# Patient Record
Sex: Female | Born: 1980 | Race: White | Hispanic: No | Marital: Married | Smoking: Never smoker
Health system: Southern US, Community
[De-identification: ages and names within clinical notes are randomized; demographics above are authoritative.]

## PROBLEM LIST (undated history)

## (undated) DIAGNOSIS — F329 Major depressive disorder, single episode, unspecified: Secondary | ICD-10-CM

## (undated) DIAGNOSIS — O2340 Unspecified infection of urinary tract in pregnancy, unspecified trimester: Secondary | ICD-10-CM

## (undated) DIAGNOSIS — F32A Depression, unspecified: Secondary | ICD-10-CM

## (undated) DIAGNOSIS — K219 Gastro-esophageal reflux disease without esophagitis: Secondary | ICD-10-CM

## (undated) DIAGNOSIS — F419 Anxiety disorder, unspecified: Secondary | ICD-10-CM

## (undated) DIAGNOSIS — Z789 Other specified health status: Secondary | ICD-10-CM

## (undated) HISTORY — PX: DILATION AND CURETTAGE OF UTERUS: SHX78

## (undated) HISTORY — PX: APPENDECTOMY: SHX54

## (undated) HISTORY — PX: TUMOR REMOVAL: SHX12

---
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
## 2000-06-16 ENCOUNTER — Observation Stay (HOSPITAL_COMMUNITY): Admission: EM | Admit: 2000-06-16 | Discharge: 2000-06-17 | Payer: Self-pay | Admitting: *Deleted

## 2000-06-21 ENCOUNTER — Other Ambulatory Visit (HOSPITAL_COMMUNITY): Admission: RE | Admit: 2000-06-21 | Discharge: 2000-07-01 | Payer: Self-pay | Admitting: *Deleted

## 2000-12-19 ENCOUNTER — Inpatient Hospital Stay (HOSPITAL_COMMUNITY): Admission: AD | Admit: 2000-12-19 | Discharge: 2000-12-19 | Payer: Self-pay | Admitting: Obstetrics & Gynecology

## 2001-01-16 ENCOUNTER — Inpatient Hospital Stay (HOSPITAL_COMMUNITY): Admission: AD | Admit: 2001-01-16 | Discharge: 2001-01-18 | Payer: Self-pay | Admitting: *Deleted

## 2001-07-07 ENCOUNTER — Encounter: Payer: Self-pay | Admitting: Emergency Medicine

## 2001-07-07 ENCOUNTER — Emergency Department (HOSPITAL_COMMUNITY): Admission: EM | Admit: 2001-07-07 | Discharge: 2001-07-08 | Payer: Self-pay | Admitting: *Deleted

## 2002-12-27 ENCOUNTER — Other Ambulatory Visit: Admission: RE | Admit: 2002-12-27 | Discharge: 2002-12-27 | Payer: Self-pay | Admitting: Obstetrics & Gynecology

## 2003-03-21 ENCOUNTER — Ambulatory Visit (HOSPITAL_COMMUNITY): Admission: AD | Admit: 2003-03-21 | Discharge: 2003-03-21 | Payer: Self-pay | Admitting: Obstetrics & Gynecology

## 2003-03-21 ENCOUNTER — Encounter (INDEPENDENT_AMBULATORY_CARE_PROVIDER_SITE_OTHER): Payer: Self-pay | Admitting: *Deleted

## 2004-09-02 ENCOUNTER — Other Ambulatory Visit: Admission: RE | Admit: 2004-09-02 | Discharge: 2004-09-02 | Payer: Self-pay | Admitting: Obstetrics & Gynecology

## 2004-12-30 ENCOUNTER — Inpatient Hospital Stay (HOSPITAL_COMMUNITY): Admission: AD | Admit: 2004-12-30 | Discharge: 2004-12-30 | Payer: Self-pay | Admitting: Obstetrics and Gynecology

## 2005-03-17 ENCOUNTER — Inpatient Hospital Stay (HOSPITAL_COMMUNITY): Admission: AD | Admit: 2005-03-17 | Discharge: 2005-03-19 | Payer: Self-pay | Admitting: Obstetrics & Gynecology

## 2005-05-31 ENCOUNTER — Other Ambulatory Visit: Admission: RE | Admit: 2005-05-31 | Discharge: 2005-05-31 | Payer: Self-pay | Admitting: Obstetrics & Gynecology

## 2006-03-14 ENCOUNTER — Inpatient Hospital Stay (HOSPITAL_COMMUNITY): Admission: AD | Admit: 2006-03-14 | Discharge: 2006-03-14 | Payer: Self-pay | Admitting: Obstetrics & Gynecology

## 2006-03-15 ENCOUNTER — Inpatient Hospital Stay (HOSPITAL_COMMUNITY): Admission: AD | Admit: 2006-03-15 | Discharge: 2006-03-17 | Payer: Self-pay | Admitting: Obstetrics & Gynecology

## 2009-05-21 ENCOUNTER — Ambulatory Visit (HOSPITAL_COMMUNITY): Admission: RE | Admit: 2009-05-21 | Discharge: 2009-05-21 | Payer: Self-pay | Admitting: Obstetrics and Gynecology

## 2009-07-16 ENCOUNTER — Ambulatory Visit (HOSPITAL_COMMUNITY): Admission: RE | Admit: 2009-07-16 | Discharge: 2009-07-16 | Payer: Self-pay | Admitting: Obstetrics and Gynecology

## 2009-12-18 ENCOUNTER — Inpatient Hospital Stay (HOSPITAL_COMMUNITY): Admission: AD | Admit: 2009-12-18 | Discharge: 2009-12-18 | Payer: Self-pay | Admitting: Obstetrics and Gynecology

## 2009-12-19 ENCOUNTER — Inpatient Hospital Stay (HOSPITAL_COMMUNITY): Admission: AD | Admit: 2009-12-19 | Discharge: 2009-12-21 | Payer: Self-pay | Admitting: Obstetrics and Gynecology

## 2010-12-25 LAB — CBC
HCT: 32.4 % — ABNORMAL LOW (ref 36.0–46.0)
Hemoglobin: 10.6 g/dL — ABNORMAL LOW (ref 12.0–15.0)
MCHC: 32.5 g/dL (ref 30.0–36.0)
MCHC: 32.8 g/dL (ref 30.0–36.0)
MCV: 78.1 fL (ref 78.0–100.0)
MCV: 78.6 fL (ref 78.0–100.0)
Platelets: 248 10*3/uL (ref 150–400)
Platelets: 280 10*3/uL (ref 150–400)
RBC: 3.73 MIL/uL — ABNORMAL LOW (ref 3.87–5.11)
RDW: 17.1 % — ABNORMAL HIGH (ref 11.5–15.5)
WBC: 15.2 10*3/uL — ABNORMAL HIGH (ref 4.0–10.5)

## 2010-12-25 LAB — COMPREHENSIVE METABOLIC PANEL
Albumin: 2.7 g/dL — ABNORMAL LOW (ref 3.5–5.2)
BUN: 7 mg/dL (ref 6–23)
Creatinine, Ser: 0.36 mg/dL — ABNORMAL LOW (ref 0.4–1.2)
Glucose, Bld: 87 mg/dL (ref 70–99)
Total Protein: 5.7 g/dL — ABNORMAL LOW (ref 6.0–8.3)

## 2010-12-25 LAB — RPR: RPR Ser Ql: NONREACTIVE

## 2010-12-25 LAB — URIC ACID: Uric Acid, Serum: 4.5 mg/dL (ref 2.4–7.0)

## 2010-12-25 LAB — LACTATE DEHYDROGENASE: LDH: 123 U/L (ref 94–250)

## 2011-02-19 NOTE — Op Note (Signed)
   NAME:  Janet Stewart, Janet Stewart                          ACCOUNT NO.:  [id number]   MEDICAL RECORD NO.:  [id number]                   PATIENT TYPE:  AMB   LOCATION:  SDC                                  FACILITY:  [facility]   PHYSICIAN:  Freddy Finner, M.D.                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   POSTOPERATIVE DIAGNOSES:  Multiple congenital anomalies of the fetus  inconsistent with life at 19 weeks to station.   OPERATIVE PROCEDURE:  D&E.   ESTIMATED BLOOD LOSS:  300 mL.   ANESTHESIA:  General.   COMPLICATIONS:  None. The patient blood type is known to be A POSITIVE.   INDICATIONS FOR PROCEDURE:  Details of the present illness recorded in the  admission note. The patient was admitted on the morning of surgery to the  outpatient surgery unit. Laminaria x2 were placed in the cervix on the day  prior to her procedure.   DESCRIPTION OF PROCEDURE:  She was brought to the operating room and there  placed under adequate general anesthesia and placed in the dorsal lithotomy  position using Allen stirrup system. Betadine prep was carried out in the  usual fashion and guaze packing and laminaria were removed from the cervix  and vagina. The cervix was visualized and grasped on the anterior lip with  ring forceps and progressively dilated with Hegar dilators to approximately  1.6 cm. The 16 mm curved suction canula was then introduced and aspiration  produced obvious products of conception. There was not a lot of fluid which  was noted by a previous ultrasound. After adequate vacuum extraction large  placental forceps were used to gradually remove fetal parts. This was  continued along with intermittent repeat vacuum aspiration until all  anatomical parts were identified. Most  importantly the skull was identified  and removed. The patient was given intravenous potassium through the IV and  subcu methods in 0.25 mg intraoperatively. She is to be discharged and the  immediate postoperative period for follow up in the office in approximately  two weeks. She is to take ibuprofen as needed for postoperative pain. The  plan is to restart her psychotropic medications. The patient was discharged  home as noted.                                               Freddy Finner, M.D.    WRN/MEDQ  D:  03/21/2003  T:  03/21/2003  Job:  [id number]

## 2011-02-19 NOTE — Discharge Summary (Signed)
[facility]  Patient:    Janet Stewart, Janet Stewart                         MRN: [id number] Adm. Date:  [id number] Disc. Date: [id number] Attending:  Denny Peon                           Discharge Summary  INTRODUCTION:  Janet Stewart is a 30 year old white single female admitted after expressing suicidal plans.  She has history of recurrent mood swings, suggesting 4 bipolar illness.  Details are available in the initial evaluation.  HOSPITAL COURSE:  Patient was admitted to the ward.  She was already on Zoloft 10 because of pregnancy.  I started gradually decreasing her Zoloft.  I ordered serum pregnancy test which came positive.  Patient was informed about her pregnancy and it made things worse for her.  She was already somehow overwhelmed prior to admission and she broke down in tears when she learned about being pregnant.  To make things worse at one point, she was informed by the staff that pregnancy test was negative and later learned otherwise.  There were also significant problems with patients insurance company which wanted to transfer patient to another hospital, since our hospital was not in her [name] network.  Patient had the choice to be discharged which we did not feel she was ready, therefore, take responsibility for the balance of the bill or agree for transfer.  Patient agreed to be transferred and she was accepted by [facility].  MEDICAL PROBLEMS:  Patient did not have medical problems during this hospital stay.  LABORATORY:  Electrolytes and blood count were normal.  Hemoglobin 13, hematocrit 38.  Pregnancy test, both urine and serum positive.  MENTAL STATUS:  At the time of discharge she presented with a pleasant affect, denied dangerous ideations, but her affect was still labile and we did not feel that she is ready to go home.  Her family agreed to take responsibility for her during the transfer to another facility.  Since  patient was not actively suicidal, I felt it was a safe solution.  As per patient request, I did not share with her mother or boyfriend the fact that the patient is most likely pregnant.  Again a consultation was ordered but cancelled, since it was obvious that patient is going to be transferred to another hospital and before late evening tonight, we are not able to obtain the consult.  DISCHARGE DIAGNOSES: Axis I:    Bipolar 2 disorder, depressed. Axis II:   Personality disorder, not otherwise specified. Axis III:  Pregnancy. Axis IV:   Moderate stressors. Axis V:    GAF on admission 30, upon discharge 40, maximum over past year            is 75.  RECOMMENDATION:  Patient is being transferred to another hospital for continuous treatment of her depression.  Upon arrival she should be evaluated by a gynecologist, and decision should be made about her medication. DD:  06/17/00 TD:  06/18/00 Job: [id number]

## 2011-02-19 NOTE — H&P (Signed)
NAME:  Janet Stewart, Janet Stewart                          ACCOUNT NO.:  [id number]   MEDICAL RECORD NO.:  [id number]                   PATIENT TYPE:   LOCATION:                                       FACILITY:   PHYSICIAN:  Freddy Finner, M.D.                DATE OF BIRTH:  1981/02/16   DATE OF ADMISSION:  03/21/2003  DATE OF DISCHARGE:                                HISTORY & PHYSICAL   ADMISSION DIAGNOSIS:  Significant fetal anomaly with ultrasound findings not  compatible with continued  intrauterine or extrauterine survival.   HISTORY OF PRESENT ILLNESS:  The patient is a 30 year old white married  female, gravida 2, para 1, who has been followed in the office to now [redacted]  weeks gestational age.  On March 18, 2003, she had a pelvic ultrasound for  routine obstetrical screening and anatomical survey with the findings in our  office of increased nickel fold thickening, marked enlargement of the  bladder, profound oligohydramnios and suggestion of scoliosis of the spine.  She was referred to Dr. Margorie Stewart at the [facility],  who evaluated her with an ultrasound on March 19, 2003, and confirmed the  marked enlargement of the bladder with a centrally low amniotic fluid.  This  he felt was consistent with bladder outlet obstruction, of undetermined  etiology at this point, but incompatible with continued intrauterine growth  or viable results of the pregnancy.  Because of these findings, the patient  is now admitted for a D&E, for termination of the pregnancy.   REVIEW OF SYSTEMS:  Her current review of systems is negative.   PAST MEDICAL HISTORY/FAMILY HISTORY:  Recorded in detail in the prenatal  summary and will not be repeated at this time.   PHYSICAL EXAMINATION:  HEENT:  Grossly within normal limits.  NECK:  Thyroid gland is not palpably enlarged.  VITAL SIGNS:  Blood pressure in the office 124/80.  CHEST:  Clear to auscultation.  HEART:  A normal sinus rhythm  without murmurs, rubs, or gallops.  ABDOMEN:  Gravid.  Fundal height of E-1.  EXTREMITIES:  Without clubbing, cyanosis, or edema.   ASSESSMENT:  Ultimately non-viable intrauterine pregnancy with significant  anomalies.   PLAN:  A dilatation and evacuation.    NOTATION:  The procedure was discussed at length with the patient by  telephone on March 19, 2003.  The risks of hemorrhage and uterine perforation  requiring additional surgery were discussed with the patient.  Based on this  as one of her two options for the termination of pregnancy, she has elected  to proceed with this procedure.  She is admitted at this time for that  purpose.  Freddy Finner, M.D.    WRN/MEDQ  D:  03/19/2003  T:  03/19/2003  Job:  [id number]

## 2011-02-19 NOTE — Op Note (Signed)
[facility]  Patient:    Janet Stewart, Janet Stewart                       MRN: [id number] Proc. Date: 01/16/01 Adm. Date:  [id number] Disc. Date: [id number] Attending:  Rhina Brackett                           Operative Report  PROCEDURE:                    Vacuum extraction.  SURGEON:                      Guy Sandifer. Arleta Creek, M.D.  INDICATIONS AND CONSENT:      The patient is a 30 year old married white female, G1, P0 with an EDC of January 19, 2001.  Prenatal care was complicated by possible bipolar disorder, although no medications at present and no symptoms at present.  Group B Strep culture was positive.  The patient was admitted to the hospital at 5 cm dilation, where she was placed on IV antibiotic prophylaxis for group B Strep sepsis.  Epidural was placed.  At 12:05 p.m., the cervix was 9 cm dilated, -1 station and artificial rupture of membranes for clear fluid was carried out.  She progressed to second stage and pushed for approximately two hours.  Fetal heart tones were 160s to 170s with accelerations.  The patient was exhausted and requesting help.  Vacuum extraction was discussed with the patient and her husband.  A 1 in 40,000 risk of severe morbidity and mortality was discussed.  All questions were answered. The vertex was at +2 station between pushes and +3 to +4 station with a push. ROA.  DESCRIPTION OF PROCEDURE:     The bladder was straight catheterized.  A Kiwi vacuum extractor was placed and steady progress over the course of 3-4 contractions was made.  A viable female infant with Apgars of 8 and 9 at one and five minutes respectively was delivered.  Birth weight pending.  Arterial cord pH was 7.19.  The placenta was three vessels and intact.  The cervix was without lacerations.  There were superficial bilateral periurethral lacerations, not repaired and not bleeding.  A second degree midline episiotomy had a small right sulcus  extension.  This was repaired using 3-0 Rapide suture.  The patient and infant were stable in the labor delivery room. DD:  01/16/01 TD:  01/16/01 Job: [id number]

## 2011-02-19 NOTE — H&P (Signed)
[facility]  Patient:    JACLYN, ANDY                         MRN: [id number] Adm. Date:  [id number] Disc. Date: [id number] Attending:  Denny Peon                   Psychiatric Admission Assessment  INTRODUCTION:  Tiwatope Emmitt is a 30 year old white single female who was admitted on involuntary papers via emergency room after expressing suicidal plans to overdose.  Patient has no previous history of suicidal behavior.  A few days prior to admission she left her boyfriend with whom she lived since February.  Patient felt like she was being a burden to him.  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  The grounding of all flights due to occurrences in [place] prevented her from departure.  Patient reports being depressed for at least six weeks.  She was started by her family doctor on Zoloft which was increased to up to 100 mg daily.  She complained of side effects of headache and mood not much better. She complained of irritability and _________ and mood swings.  In the past she tended to make rapid changes, not always well thought, like leaving collage, breaking employment arrangements, breaking engagements, etc.  Patient denies any history of substance abuse.  She gives history of depression starting as early as age 8.  She was treated first with Prozac, later with Serzone.  Both medications were somewhat helpful, but patient later developed severe mood swings of hypomanic way like qualities.  At one point she became suicidal.  SOCIAL HISTORY:  Patient has some family support.  She dropped out of college which did not make family happy.  She does not work and intends to start studying again.  In the past she was a good Consulting civil engineer.  FAMILY HISTORY:  Significant for two maternal uncles with depression.  ALCOHOL AND DRUG HISTORY:  Negative.  MEDICAL PROBLEMS:  Patient denied medical problems.  Last  menstrual period was at the end of August of 2001.   Pregnancy test in urine was positive in emergency room.  Patient does not use birth control pills, but she was not recently sexually active.  She uses Zoloft 100 mg daily.  PHYSICAL EXAMINATION:  Normal in the emergency room.  MENTAL STATUS:  Patient is overweight, quite chubby, white female with superficially bright affect, but easy to cry.  Normal speech, neat appearance and behavior.  Speech was fast but not pressured.  Mood was depressed and affect was labile.  She reported racing thoughts, denied suicidal thoughts, but felt confused what to do next.  She was overwhelmed by the fact of a possible pregnancy.  There were no delusions, no ideas of reference, no homicidal thoughts, suicidal thoughts, no paranoia.  Alert, oriented x 3 with good memory.  Concentration was decreased.  Intellectually normal.  Insight was superficial and judgment poor.  DIAGNOSTIC IMPRESSION: Axis I:    Bipolar disorder 2, mixed. Axis II:   Personality disorder, not otherwise specified. Axis III:  Rule out pregnancy. Axis IV:   Moderate stressor, primary support group. Axis V:    GAF at the time of admission 30, maximum over past year 75.  RECOMMENDATION:  We will start patient on special _________ in an attempt to make a family meeting.  Ask for OB-GYN  consultation to endorse problem of medication.  Patient seems to be in need of antidepressant and individual mood stabilizer.  At the same time, her pregnancy does not allow [place] to use medication freely.  Patient is on Zoloft for the past month or so.  Will gradually decreased Zoloft to avoid withdrawal and with being in touch with obstetrician, will try to establish safe pattern of mediation in the future. Patient agreed to this plan as outlined. DD:  06/17/00 TD:  06/18/00 Job: [id number]

## 2013-05-04 ENCOUNTER — Other Ambulatory Visit: Payer: Self-pay | Admitting: Gastroenterology

## 2013-05-04 DIAGNOSIS — R1032 Left lower quadrant pain: Secondary | ICD-10-CM

## 2013-05-11 ENCOUNTER — Other Ambulatory Visit: Payer: Self-pay

## 2013-05-16 ENCOUNTER — Other Ambulatory Visit: Payer: Self-pay

## 2013-05-24 ENCOUNTER — Ambulatory Visit
Admission: RE | Admit: 2013-05-24 | Discharge: 2013-05-24 | Disposition: A | Discharge status: Home / Self Care | Payer: BC Managed Care – PPO | Source: Ambulatory Visit | Attending: Gastroenterology | Admitting: Gastroenterology

## 2013-05-24 DIAGNOSIS — R1032 Left lower quadrant pain: Secondary | ICD-10-CM

## 2013-05-24 MED ORDER — IOHEXOL 300 MG/ML  SOLN
125.0000 mL | Freq: Once | INTRAMUSCULAR | Status: AC | PRN
  Administered 2013-05-24: 125 mL via INTRAVENOUS

## 2018-10-04 NOTE — L&D Delivery Note (Addendum)
Delivery Note  First Stage: Labor onset: 04/03/2019 @ 8pm  Augmentation: Cytotec 24mcg  Analgesia /Anesthesia intrapartum: Fentanyl 180mcg IVP x 1 SROM at 8pm for clear fluid  Second Stage: Complete dilation at 0306 Onset of pushing at 0306 FHR second stage: Cat 2/Cat 3  Delivery of a viable female at 65 by Lars Pinks, CNM in LOA position with left compound hand. I moved left hand across body and delivered posterior arm, then anterior shoulder and body followed easily. No nuchal cord.   Cord double clamped after cessation of pulsation, cut by CNM and brought to warmer for NICU to assess due to poor tone.  Cord blood sample collected   Third Stage: Large placenta with thick umbilical cord delivered via Delena Bali intact with 3 VC @ 0325 Placenta disposition: hospital disposal  Uterine tone firm / bleeding minimal   No lacerations identified   Est. Blood Loss (mL): 27mL by QBL; I estimated about 40XB  Complications: none  Mom to postpartum.  Baby to Couplet care / Skin to Skin.  Newborn: Birth Weight: 8#15oz Apgar Scores: 6, 8 Feeding planned: breast  Lars Pinks, MSN, CNM [facility]

## 2018-11-22 ENCOUNTER — Other Ambulatory Visit (HOSPITAL_COMMUNITY): Payer: Self-pay | Admitting: Obstetrics & Gynecology

## 2018-11-22 ENCOUNTER — Other Ambulatory Visit (HOSPITAL_COMMUNITY): Payer: Self-pay | Admitting: *Deleted

## 2018-11-22 ENCOUNTER — Ambulatory Visit (HOSPITAL_COMMUNITY)
Admission: RE | Admit: 2018-11-22 | Discharge: 2018-11-22 | Disposition: A | Discharge status: Home / Self Care | Payer: 59 | Source: Ambulatory Visit | Attending: Obstetrics & Gynecology | Admitting: Obstetrics & Gynecology

## 2018-11-22 ENCOUNTER — Encounter (HOSPITAL_COMMUNITY): Payer: Self-pay

## 2018-11-22 DIAGNOSIS — Z3689 Encounter for other specified antenatal screening: Secondary | ICD-10-CM | POA: Insufficient documentation

## 2018-11-22 DIAGNOSIS — O09212 Supervision of pregnancy with history of pre-term labor, second trimester: Secondary | ICD-10-CM

## 2018-11-22 DIAGNOSIS — O359XX0 Maternal care for (suspected) fetal abnormality and damage, unspecified, not applicable or unspecified: Secondary | ICD-10-CM | POA: Diagnosis not present

## 2018-11-22 DIAGNOSIS — R188 Other ascites: Secondary | ICD-10-CM

## 2018-11-22 DIAGNOSIS — Z3A19 19 weeks gestation of pregnancy: Secondary | ICD-10-CM

## 2018-11-22 DIAGNOSIS — Z3A21 21 weeks gestation of pregnancy: Secondary | ICD-10-CM

## 2018-11-22 DIAGNOSIS — O09292 Supervision of pregnancy with other poor reproductive or obstetric history, second trimester: Secondary | ICD-10-CM

## 2018-11-22 DIAGNOSIS — Z363 Encounter for antenatal screening for malformations: Secondary | ICD-10-CM

## 2018-11-22 DIAGNOSIS — O09522 Supervision of elderly multigravida, second trimester: Secondary | ICD-10-CM | POA: Diagnosis not present

## 2018-11-22 DIAGNOSIS — O283 Abnormal ultrasonic finding on antenatal screening of mother: Secondary | ICD-10-CM

## 2018-11-22 HISTORY — DX: Other specified health status: Z78.9

## 2018-11-23 ENCOUNTER — Other Ambulatory Visit (HOSPITAL_COMMUNITY): Payer: Self-pay | Admitting: *Deleted

## 2018-11-23 DIAGNOSIS — IMO0002 Reserved for concepts with insufficient information to code with codable children: Secondary | ICD-10-CM

## 2018-11-23 DIAGNOSIS — R188 Other ascites: Principal | ICD-10-CM

## 2018-11-23 LAB — TOXOPLASMA ANTIBODIES- IGG AND  IGM: Toxoplasma Antibody- IgM: <3 AU/mL (ref 0.0–7.9)

## 2018-11-23 LAB — CMV ANTIBODY, IGG (EIA)
CMV AB - IGG: 1.6 U/mL — AB (ref 0.00–0.59)
CMV AB - IGG: 1.6 U/mL — AB (ref 0.00–0.59)

## 2018-11-23 LAB — CMV IGM: CMV IgM: <30 AU/mL (ref 0.0–29.9)

## 2018-11-24 ENCOUNTER — Other Ambulatory Visit (HOSPITAL_COMMUNITY): Payer: Self-pay | Admitting: Obstetrics & Gynecology

## 2018-12-06 ENCOUNTER — Ambulatory Visit (HOSPITAL_COMMUNITY)
Admission: RE | Admit: 2018-12-06 | Discharge: 2018-12-06 | Disposition: A | Discharge status: Home / Self Care | Payer: 59 | Source: Ambulatory Visit | Attending: Obstetrics & Gynecology | Admitting: Obstetrics & Gynecology

## 2018-12-06 ENCOUNTER — Other Ambulatory Visit: Payer: Self-pay

## 2018-12-06 ENCOUNTER — Ambulatory Visit (HOSPITAL_COMMUNITY): Payer: 59 | Admitting: *Deleted

## 2018-12-06 ENCOUNTER — Other Ambulatory Visit (HOSPITAL_COMMUNITY): Payer: Self-pay | Admitting: *Deleted

## 2018-12-06 ENCOUNTER — Encounter (HOSPITAL_COMMUNITY): Payer: Self-pay

## 2018-12-06 VITALS — BP 135/82 | HR 96 | Ht 63.0 in | Wt 224.2 lb

## 2018-12-06 DIAGNOSIS — O283 Abnormal ultrasonic finding on antenatal screening of mother: Secondary | ICD-10-CM | POA: Diagnosis present

## 2018-12-06 DIAGNOSIS — R188 Other ascites: Secondary | ICD-10-CM | POA: Diagnosis present

## 2018-12-06 DIAGNOSIS — O359XX0 Maternal care for (suspected) fetal abnormality and damage, unspecified, not applicable or unspecified: Secondary | ICD-10-CM

## 2018-12-06 DIAGNOSIS — O09292 Supervision of pregnancy with other poor reproductive or obstetric history, second trimester: Secondary | ICD-10-CM | POA: Diagnosis not present

## 2018-12-06 DIAGNOSIS — O09212 Supervision of pregnancy with history of pre-term labor, second trimester: Secondary | ICD-10-CM

## 2018-12-06 DIAGNOSIS — Z3A21 21 weeks gestation of pregnancy: Secondary | ICD-10-CM

## 2018-12-06 DIAGNOSIS — IMO0002 Reserved for concepts with insufficient information to code with codable children: Secondary | ICD-10-CM

## 2018-12-06 DIAGNOSIS — O09522 Supervision of elderly multigravida, second trimester: Secondary | ICD-10-CM

## 2018-12-06 DIAGNOSIS — B259 Cytomegaloviral disease, unspecified: Secondary | ICD-10-CM

## 2018-12-13 LAB — MISC LABCORP TEST (SEND OUT)
LABCORP TEST CODE: 819024
MISC LABCORP RESULT: UNDETERMINED

## 2018-12-26 ENCOUNTER — Ambulatory Visit (HOSPITAL_COMMUNITY): Payer: 59

## 2018-12-26 ENCOUNTER — Encounter (HOSPITAL_COMMUNITY): Payer: Self-pay

## 2019-02-07 ENCOUNTER — Encounter (HOSPITAL_COMMUNITY): Payer: Self-pay | Admitting: *Deleted

## 2019-02-07 ENCOUNTER — Ambulatory Visit (HOSPITAL_COMMUNITY): Payer: 59 | Admitting: *Deleted

## 2019-02-07 ENCOUNTER — Other Ambulatory Visit: Payer: Self-pay

## 2019-02-07 ENCOUNTER — Ambulatory Visit (HOSPITAL_COMMUNITY)
Admission: RE | Admit: 2019-02-07 | Discharge: 2019-02-07 | Disposition: A | Discharge status: Home / Self Care | Payer: 59 | Source: Ambulatory Visit | Attending: Obstetrics and Gynecology | Admitting: Obstetrics and Gynecology

## 2019-02-07 ENCOUNTER — Other Ambulatory Visit (HOSPITAL_COMMUNITY): Payer: Self-pay | Admitting: Obstetrics & Gynecology

## 2019-02-07 VITALS — Temp 98.9°F

## 2019-02-07 DIAGNOSIS — O09213 Supervision of pregnancy with history of pre-term labor, third trimester: Secondary | ICD-10-CM

## 2019-02-07 DIAGNOSIS — Z362 Encounter for other antenatal screening follow-up: Secondary | ICD-10-CM | POA: Diagnosis not present

## 2019-02-07 DIAGNOSIS — O283 Abnormal ultrasonic finding on antenatal screening of mother: Secondary | ICD-10-CM

## 2019-02-07 DIAGNOSIS — R188 Other ascites: Principal | ICD-10-CM

## 2019-02-07 DIAGNOSIS — O09523 Supervision of elderly multigravida, third trimester: Secondary | ICD-10-CM | POA: Diagnosis not present

## 2019-02-07 DIAGNOSIS — O09293 Supervision of pregnancy with other poor reproductive or obstetric history, third trimester: Secondary | ICD-10-CM | POA: Diagnosis not present

## 2019-02-07 DIAGNOSIS — Z3A30 30 weeks gestation of pregnancy: Secondary | ICD-10-CM

## 2019-02-07 DIAGNOSIS — O359XX0 Maternal care for (suspected) fetal abnormality and damage, unspecified, not applicable or unspecified: Secondary | ICD-10-CM

## 2019-02-07 DIAGNOSIS — IMO0002 Reserved for concepts with insufficient information to code with codable children: Secondary | ICD-10-CM

## 2019-02-08 ENCOUNTER — Other Ambulatory Visit (HOSPITAL_COMMUNITY): Payer: Self-pay | Admitting: *Deleted

## 2019-02-08 DIAGNOSIS — R188 Other ascites: Principal | ICD-10-CM

## 2019-02-08 DIAGNOSIS — IMO0002 Reserved for concepts with insufficient information to code with codable children: Secondary | ICD-10-CM

## 2019-03-08 ENCOUNTER — Ambulatory Visit (HOSPITAL_COMMUNITY): Payer: 59 | Admitting: *Deleted

## 2019-03-08 ENCOUNTER — Encounter (HOSPITAL_COMMUNITY): Payer: Self-pay

## 2019-03-08 ENCOUNTER — Ambulatory Visit (HOSPITAL_COMMUNITY)
Admission: RE | Admit: 2019-03-08 | Discharge: 2019-03-08 | Disposition: A | Discharge status: Home / Self Care | Payer: 59 | Source: Ambulatory Visit | Attending: Obstetrics and Gynecology | Admitting: Obstetrics and Gynecology

## 2019-03-08 ENCOUNTER — Other Ambulatory Visit: Payer: Self-pay

## 2019-03-08 VITALS — BP 131/84 | HR 115 | Temp 98.6°F

## 2019-03-08 DIAGNOSIS — R188 Other ascites: Secondary | ICD-10-CM | POA: Diagnosis present

## 2019-03-08 DIAGNOSIS — IMO0002 Reserved for concepts with insufficient information to code with codable children: Secondary | ICD-10-CM

## 2019-03-08 DIAGNOSIS — O0990 Supervision of high risk pregnancy, unspecified, unspecified trimester: Secondary | ICD-10-CM | POA: Diagnosis present

## 2019-03-08 DIAGNOSIS — O359XX0 Maternal care for (suspected) fetal abnormality and damage, unspecified, not applicable or unspecified: Secondary | ICD-10-CM

## 2019-03-08 DIAGNOSIS — O09523 Supervision of elderly multigravida, third trimester: Secondary | ICD-10-CM

## 2019-03-08 DIAGNOSIS — O09293 Supervision of pregnancy with other poor reproductive or obstetric history, third trimester: Secondary | ICD-10-CM | POA: Diagnosis not present

## 2019-03-08 DIAGNOSIS — Z362 Encounter for other antenatal screening follow-up: Secondary | ICD-10-CM | POA: Diagnosis not present

## 2019-03-08 DIAGNOSIS — O09213 Supervision of pregnancy with history of pre-term labor, third trimester: Secondary | ICD-10-CM

## 2019-03-08 DIAGNOSIS — Z3A34 34 weeks gestation of pregnancy: Secondary | ICD-10-CM

## 2019-04-03 ENCOUNTER — Encounter (HOSPITAL_COMMUNITY): Payer: Self-pay | Admitting: *Deleted

## 2019-04-03 ENCOUNTER — Other Ambulatory Visit: Payer: Self-pay

## 2019-04-03 ENCOUNTER — Inpatient Hospital Stay (HOSPITAL_COMMUNITY)
Admission: AD | Admit: 2019-04-03 | Discharge: 2019-04-05 | DRG: 807 | Disposition: A | Discharge status: Home / Self Care | Payer: 59 | Attending: Obstetrics and Gynecology | Admitting: Obstetrics and Gynecology

## 2019-04-03 DIAGNOSIS — O3483 Maternal care for other abnormalities of pelvic organs, third trimester: Secondary | ICD-10-CM | POA: Diagnosis present

## 2019-04-03 DIAGNOSIS — Z3A38 38 weeks gestation of pregnancy: Secondary | ICD-10-CM

## 2019-04-03 DIAGNOSIS — D649 Anemia, unspecified: Secondary | ICD-10-CM | POA: Diagnosis present

## 2019-04-03 DIAGNOSIS — Z1159 Encounter for screening for other viral diseases: Secondary | ICD-10-CM | POA: Diagnosis not present

## 2019-04-03 DIAGNOSIS — O99824 Streptococcus B carrier state complicating childbirth: Secondary | ICD-10-CM | POA: Diagnosis present

## 2019-04-03 DIAGNOSIS — O3663X0 Maternal care for excessive fetal growth, third trimester, not applicable or unspecified: Secondary | ICD-10-CM | POA: Diagnosis present

## 2019-04-03 DIAGNOSIS — Z3689 Encounter for other specified antenatal screening: Secondary | ICD-10-CM | POA: Diagnosis not present

## 2019-04-03 DIAGNOSIS — O9902 Anemia complicating childbirth: Secondary | ICD-10-CM | POA: Diagnosis present

## 2019-04-03 DIAGNOSIS — O4202 Full-term premature rupture of membranes, onset of labor within 24 hours of rupture: Secondary | ICD-10-CM | POA: Diagnosis present

## 2019-04-03 DIAGNOSIS — N83291 Other ovarian cyst, right side: Secondary | ICD-10-CM | POA: Diagnosis present

## 2019-04-03 DIAGNOSIS — O24420 Gestational diabetes mellitus in childbirth, diet controlled: Secondary | ICD-10-CM | POA: Diagnosis present

## 2019-04-03 HISTORY — DX: Depression, unspecified: F32.A

## 2019-04-03 HISTORY — DX: Gastro-esophageal reflux disease without esophagitis: K21.9

## 2019-04-03 HISTORY — DX: Unspecified infection of urinary tract in pregnancy, unspecified trimester: O23.40

## 2019-04-03 HISTORY — DX: Anxiety disorder, unspecified: F41.9

## 2019-04-03 LAB — URINALYSIS, ROUTINE W REFLEX MICROSCOPIC
Bilirubin Urine: NEGATIVE
Glucose, UA: NEGATIVE mg/dL
Ketones, ur: NEGATIVE mg/dL
Leukocytes,Ua: NEGATIVE
Nitrite: NEGATIVE
Protein, ur: 100 mg/dL — AB
RBC / HPF: >50 RBC/hpf — ABNORMAL HIGH (ref 0–5)
Specific Gravity, Urine: 1.006 (ref 1.005–1.030)
pH: 7 (ref 5.0–8.0)

## 2019-04-03 LAB — COMPREHENSIVE METABOLIC PANEL
ALT: 13 U/L (ref 0–44)
AST: 14 U/L — ABNORMAL LOW (ref 15–41)
Albumin: 2.6 g/dL — ABNORMAL LOW (ref 3.5–5.0)
Alkaline Phosphatase: 173 U/L — ABNORMAL HIGH (ref 38–126)
Anion gap: 10 (ref 5–15)
BUN: 6 mg/dL (ref 6–20)
CO2: 22 mmol/L (ref 22–32)
Calcium: 9.1 mg/dL (ref 8.9–10.3)
Chloride: 104 mmol/L (ref 98–111)
Creatinine, Ser: 0.38 mg/dL — ABNORMAL LOW (ref 0.44–1.00)
GFR calc Af Amer: >60 mL/min (ref >60)
GFR calc non Af Amer: >60 mL/min (ref >60)
Glucose, Bld: 95 mg/dL (ref 70–99)
Potassium: 4 mmol/L (ref 3.5–5.1)
Sodium: 136 mmol/L (ref 135–145)
Total Bilirubin: 0.3 mg/dL (ref 0.3–1.2)
Total Protein: 5.8 g/dL — ABNORMAL LOW (ref 6.5–8.1)

## 2019-04-03 LAB — CBC
HCT: 34.8 % — ABNORMAL LOW (ref 36.0–46.0)
Hemoglobin: 10.7 g/dL — ABNORMAL LOW (ref 12.0–15.0)
MCH: 24.8 pg — ABNORMAL LOW (ref 26.0–34.0)
MCHC: 30.7 g/dL (ref 30.0–36.0)
MCV: 80.6 fL (ref 80.0–100.0)
Platelets: 258 10*3/uL (ref 150–400)
RBC: 4.32 MIL/uL (ref 3.87–5.11)
RDW: 19.1 % — ABNORMAL HIGH (ref 11.5–15.5)
WBC: 9.3 10*3/uL (ref 4.0–10.5)
nRBC: 0 % (ref 0.0–0.2)

## 2019-04-03 LAB — POCT FERN TEST: POCT Fern Test: POSITIVE

## 2019-04-03 LAB — TYPE AND SCREEN
ABO/RH(D): O POS
Antibody Screen: NEGATIVE

## 2019-04-03 LAB — URIC ACID: Uric Acid, Serum: 4 mg/dL (ref 2.5–7.1)

## 2019-04-03 LAB — SARS CORONAVIRUS 2 BY RT PCR (HOSPITAL ORDER, PERFORMED IN ~~LOC~~ HOSPITAL LAB): SARS Coronavirus 2: NEGATIVE

## 2019-04-03 MED ORDER — LACTATED RINGERS IV SOLN: 500.0000 mL | INTRAVENOUS | Status: DC | PRN

## 2019-04-03 MED ORDER — LIDOCAINE HCL (PF) 1 % IJ SOLN: 30.0000 mL | INTRAMUSCULAR | Status: DC | PRN

## 2019-04-03 MED ORDER — OXYCODONE-ACETAMINOPHEN 5-325 MG PO TABS: 2.0000 | ORAL_TABLET | ORAL | Status: DC | PRN

## 2019-04-03 MED ORDER — OXYTOCIN 40 UNITS IN NORMAL SALINE INFUSION - SIMPLE MED
2.5000 [IU]/h | INTRAVENOUS | Status: DC
  Filled 2019-04-03: qty 1000

## 2019-04-03 MED ORDER — SOD CITRATE-CITRIC ACID 500-334 MG/5ML PO SOLN: 30.0000 mL | ORAL | Status: DC | PRN

## 2019-04-03 MED ORDER — LACTATED RINGERS IV SOLN
INTRAVENOUS | Status: DC
  Administered 2019-04-03: 23:00:00 via INTRAVENOUS

## 2019-04-03 MED ORDER — ACETAMINOPHEN 325 MG PO TABS: 650.0000 mg | ORAL_TABLET | ORAL | Status: DC | PRN

## 2019-04-03 MED ORDER — OXYTOCIN BOLUS FROM INFUSION
500.0000 mL | Freq: Once | INTRAVENOUS | Status: AC
  Administered 2019-04-04: 500 mL via INTRAVENOUS

## 2019-04-03 MED ORDER — PENICILLIN G 3 MILLION UNITS IVPB - SIMPLE MED: 3.0000 10*6.[IU] | INTRAVENOUS | Status: DC

## 2019-04-03 MED ORDER — FLEET ENEMA 7-19 GM/118ML RE ENEM: 1.0000 | ENEMA | RECTAL | Status: DC | PRN

## 2019-04-03 MED ORDER — ONDANSETRON HCL 4 MG/2ML IJ SOLN: 4.0000 mg | Freq: Four times a day (QID) | INTRAMUSCULAR | Status: DC | PRN

## 2019-04-03 MED ORDER — OXYCODONE-ACETAMINOPHEN 5-325 MG PO TABS
1.0000 | ORAL_TABLET | ORAL | Status: DC | PRN
  Administered 2019-04-04: 04:00:00 1 via ORAL
  Filled 2019-04-03: qty 1

## 2019-04-03 MED ORDER — SODIUM CHLORIDE 0.9 % IV SOLN
5.0000 10*6.[IU] | Freq: Once | INTRAVENOUS | Status: AC
  Administered 2019-04-03: 5 10*6.[IU] via INTRAVENOUS
  Filled 2019-04-03: qty 5

## 2019-04-03 NOTE — MAU Provider Note (Signed)
This is a [id number] at [redacted]w[redacted]d being admitted for SROM.  I was asked to verify presentation due to high station  Pt informed that the ultrasound is considered a limited OB ultrasound and is not intended to be a complete ultrasound exam.  Patient also informed that the ultrasound is not being completed with the intent of assessing for fetal or placental anomalies or any pelvic abnormalities.  Explained that the purpose of today's ultrasound is to assess for presentation  Patient acknowledges the purpose of the exam and the limitations of the study.    Vertex is seen as the presenting part Saggital suture visualized to confirm  Seabron Spates, CNM

## 2019-04-03 NOTE — H&P (Signed)
OB ADMISSION/ HISTORY & PHYSICAL:  Admission Date: 04/03/2019  9:04 PM  Admit Diagnosis: PROM at 38+1 weeks   Janet Stewart is a 37 y.o. female G6P4 at 38+1 weeks presenting for PROM at 8pm for clear fluid.  Prenatal History: [id number]   EDC : 04/16/2019, by 8 week sono Prenatal care at [facility] since 7 weeks Primary Ob Provider: CNM care for first visit then transfer to Dr. Benjie Karvonen for high risk needs Prenatal course complicated by - Poor obstetrical history - Hx. Of 2nd trimester loss due to fluid in chest cavity per patient  - AMA 38 yo - On initial 8 week sono, pleural effusions noted, but had resolved by 10 week repeat sono - Complex right ovarian cyst - CMV IgG positive, but avidity lab error so not processed, IGM negative (patient reported having 5ths disease outside of pregnancy) - Parvovirus IgG shows immunity (past infection) - no further MCA dopplers performed - Fetal Ascites noted on CUS - s/p MFM consult and serial ultrasounds, resolved - Echogenic bowel resolved - A1GDM - CBGs WNL  - LGA - Transient elevated BP without dx of GHTN - Hx. Of PTB at 34 weeks, was on Makena injections with previous pregnancy, but not with current pregnancy - GBS bacteriuria  - Obesity  Prenatal Labs: ABO, Rh:  O Positive Antibody:  pending Rubella:   Immune RPR:   NR HBsAg:   Neg HIV:   NR GTT: 164, declined 3hr GTT GBS:   Positive Panorama WNL AFP-1 WNL Toxoplasma negative  CUS - fetal ascited, no pericardial/pleural effusion, otherwise normal female anatomy Last growth: 03/08/2019 EFW 8# >90%, AC>97%, BPD >99%  Medical / Surgical History :  Past medical history:  Past Medical History:  Diagnosis Date  . Anxiety   . Depression   . GERD (gastroesophageal reflux disease)   . Medical history non-contributory   . UTI (urinary tract infection) during pregnancy      Past surgical history:  Past Surgical History:  Procedure Laterality Date  . APPENDECTOMY    . DILATION  AND CURETTAGE OF UTERUS    . TUMOR REMOVAL      Family History: No family history on file.   Social History:  reports that she has never smoked. She has never used smokeless tobacco. She reports that she does not drink alcohol or use drugs.   Allergies: Patient has no known allergies.    Current Medications at time of admission:  Prior to Admission medications   Medication Sig Start Date End Date Taking? Authorizing Provider  ferrous sulfate 325 (65 FE) MG tablet Take 325 mg by mouth daily with breakfast.   Yes [provider]  Prenatal Vit-Fe Fumarate-FA (PRENATAL VITAMINS PO) Take by mouth.   Yes [provider]  Zolpidem Tartrate (AMBIEN PO) Take by mouth.   Yes [provider]     Review of Systems: Active FM Denies HA, visual changes, RUQ/epigastric pain Irregular LOF  / SROM @ 8pm No bloody show   Physical Exam:  VS: Blood pressure 133/78, pulse (!) 112, temperature 98.7 F (37.1 C), temperature source Oral, resp. rate 18, height 5\' 3"  (1.6 m), weight 106.1 kg, last menstrual period 06/18/2018, SpO2 100 %. 04/03/19 2326  98.2 F (36.8 C)  -  -  -  -  -  -  -  104.3 kg LM   04/03/19 2202  -  112Abnormal   -  -  133/78  -  -  -  -  TL   04/03/19 2151  -  128Abnormal   -  -  130/83  -  -  -  - TL   04/03/19 2139  -  -  -  -  143/85Abnormal   Sitting  -  -  - TL   04/03/19 2126  98.7 F (37.1 C)  125Abnormal   -  18  144/84Abnormal   Sitting  100 %  -  106.1 kg TL       General: alert and oriented Heart: RRR Lungs: Clear lung fields Abdomen: Gravid, soft and non-tender, non-distended / uterus: gravid, non-tender/ EFW by Leopold's 9-9.8# Extremities: no edema  Genitalia / VE: Dilation: Fingertip Effacement (%): 60 Station: -3 Exam by:: K. WEissRN Vtx confirmed by Bedside ultrasound in MAU by CNM  FHR: baseline rate 140 bpm / variability  moderate / accelerations +15x15 / no decelerations TOCO: every 2-5 minutes/mild to palpation    Assessment: 38+[redacted] weeks gestation Latent stage of labor FHR category 1 GBS positive LGA   Plan:  [facility]   - Routine labor and delivery orders   - Pain management: epidural PRN   - PIH labs for initial elevated BP on admission   - Unfavorable cervix: Cytotec 50mcg buccal x 1  2. GBS Positive    - Penicillin 3. Postpartum:   - Breastfeeding 4. Anticipate MOD: guarded   - Proven pelvis: 7#12   - LGA counseled on risks again including but not limited to shoulder dystocia, brachial plexus injury, asphyxia, and need for cesarean section - At present, accepts risks and trial of labor    Dr. Billy Coastaavon notified of admission / plan of care  Janet JewsMeredith Jayvier Burgher, MSN, CNM [facility]

## 2019-04-03 NOTE — MAU Note (Signed)
PT c/o of gush of fluid around 1 hour ago. Clear fluid. Some mild contractions. Denies VB. +FM.  Was closed last week.

## 2019-04-04 ENCOUNTER — Encounter (HOSPITAL_COMMUNITY): Payer: Self-pay | Admitting: *Deleted

## 2019-04-04 LAB — ABO/RH: ABO/RH(D): O POS

## 2019-04-04 LAB — CBC
HCT: 32.4 % — ABNORMAL LOW (ref 36.0–46.0)
Hemoglobin: 10.1 g/dL — ABNORMAL LOW (ref 12.0–15.0)
MCH: 25.1 pg — ABNORMAL LOW (ref 26.0–34.0)
MCHC: 31.2 g/dL (ref 30.0–36.0)
MCV: 80.4 fL (ref 80.0–100.0)
Platelets: 249 10*3/uL (ref 150–400)
RBC: 4.03 MIL/uL (ref 3.87–5.11)
RDW: 19.1 % — ABNORMAL HIGH (ref 11.5–15.5)
WBC: 18.4 10*3/uL — ABNORMAL HIGH (ref 4.0–10.5)
nRBC: 0 % (ref 0.0–0.2)

## 2019-04-04 LAB — RPR: RPR Ser Ql: NONREACTIVE

## 2019-04-04 LAB — PROTEIN / CREATININE RATIO, URINE
Creatinine, Urine: 25.69 mg/dL
Protein Creatinine Ratio: 2.22 mg/mg{Cre} — ABNORMAL HIGH (ref 0.00–0.15)
Total Protein, Urine: 57 mg/dL

## 2019-04-04 MED ORDER — ZOLPIDEM TARTRATE 5 MG PO TABS: 5.0000 mg | ORAL_TABLET | Freq: Every evening | ORAL | Status: DC | PRN

## 2019-04-04 MED ORDER — FENTANYL CITRATE (PF) 100 MCG/2ML IJ SOLN
100.0000 ug | INTRAMUSCULAR | Status: DC | PRN
  Administered 2019-04-04: 02:00:00 100 ug via INTRAVENOUS
  Filled 2019-04-04: qty 2

## 2019-04-04 MED ORDER — MISOPROSTOL 50MCG HALF TABLET
50.0000 ug | ORAL_TABLET | Freq: Once | ORAL | Status: AC
  Administered 2019-04-04: 01:00:00 50 ug via BUCCAL
  Filled 2019-04-04: qty 1

## 2019-04-04 MED ORDER — BENZOCAINE-MENTHOL 20-0.5 % EX AERO: 1.0000 "application " | INHALATION_SPRAY | CUTANEOUS | Status: DC | PRN

## 2019-04-04 MED ORDER — OXYCODONE HCL 5 MG PO TABS: 5.0000 mg | ORAL_TABLET | ORAL | Status: DC | PRN

## 2019-04-04 MED ORDER — OXYCODONE HCL 5 MG PO TABS: 10.0000 mg | ORAL_TABLET | ORAL | Status: DC | PRN

## 2019-04-04 MED ORDER — ACETAMINOPHEN 325 MG PO TABS: 650.0000 mg | ORAL_TABLET | ORAL | Status: DC | PRN

## 2019-04-04 MED ORDER — COCONUT OIL OIL: 1.0000 "application " | TOPICAL_OIL | Status: DC | PRN

## 2019-04-04 MED ORDER — PRENATAL MULTIVITAMIN CH
1.0000 | ORAL_TABLET | Freq: Every day | ORAL | Status: DC
  Administered 2019-04-04 – 2019-04-05 (×2): 1 via ORAL
  Filled 2019-04-04 (×2): qty 1

## 2019-04-04 MED ORDER — SENNOSIDES-DOCUSATE SODIUM 8.6-50 MG PO TABS
2.0000 | ORAL_TABLET | ORAL | Status: DC
  Administered 2019-04-05: 2 via ORAL
  Filled 2019-04-04: qty 2

## 2019-04-04 MED ORDER — SIMETHICONE 80 MG PO CHEW: 80.0000 mg | CHEWABLE_TABLET | ORAL | Status: DC | PRN

## 2019-04-04 MED ORDER — IBUPROFEN 600 MG PO TABS
600.0000 mg | ORAL_TABLET | Freq: Four times a day (QID) | ORAL | Status: DC
  Administered 2019-04-04 – 2019-04-05 (×6): 600 mg via ORAL
  Filled 2019-04-04 (×6): qty 1

## 2019-04-04 MED ORDER — WITCH HAZEL-GLYCERIN EX PADS: 1.0000 "application " | MEDICATED_PAD | CUTANEOUS | Status: DC | PRN

## 2019-04-04 MED ORDER — TERBUTALINE SULFATE 1 MG/ML IJ SOLN: 0.2500 mg | Freq: Once | INTRAMUSCULAR | Status: DC | PRN

## 2019-04-04 MED ORDER — DIBUCAINE (PERIANAL) 1 % EX OINT: 1.0000 "application " | TOPICAL_OINTMENT | CUTANEOUS | Status: DC | PRN

## 2019-04-04 MED ORDER — ONDANSETRON HCL 4 MG/2ML IJ SOLN: 4.0000 mg | INTRAMUSCULAR | Status: DC | PRN

## 2019-04-04 MED ORDER — DIPHENHYDRAMINE HCL 25 MG PO CAPS: 25.0000 mg | ORAL_CAPSULE | Freq: Four times a day (QID) | ORAL | Status: DC | PRN

## 2019-04-04 MED ORDER — ONDANSETRON HCL 4 MG PO TABS: 4.0000 mg | ORAL_TABLET | ORAL | Status: DC | PRN

## 2019-04-04 MED ORDER — TETANUS-DIPHTH-ACELL PERTUSSIS 5-2.5-18.5 LF-MCG/0.5 IM SUSP: 0.5000 mL | Freq: Once | INTRAMUSCULAR | Status: DC

## 2019-04-04 NOTE — Lactation Note (Signed)
This note was copied from a baby's chart. Lactation Consultation Note  Patient Name: Janet Stewart [id number] Date: 04/04/2019 Reason for consult: Initial assessment;Early term 45-38.6wks  Visited with P5 Mom of ET infant at 43 hrs old.  Mom states "this baby is latching better than any of the other babies".  Mom denies any difficulty latching baby.  Encouraged STS, and offering breast with any cue.  Goal of >8 feedings per 24 hrs.   Lactation brochure left with Mom.  Mom aware of IP and OP lactation support available to her, encouraged to call prn.   Interventions Interventions: Breast feeding basics reviewed;Skin to skin;Breast massage;Hand express  Lactation Tools Discussed/Used WIC Program: No   Consult Status Consult Status: Follow-up Date: 04/05/19 Follow-up type: In-patient    Broadus John 04/04/2019, 4:19 PM

## 2019-04-04 NOTE — Progress Notes (Addendum)
Late entry note: Reviewed PIH labs and p/c ratio 2.22, but otherwise labs WNL and normal BPs since admission. Maternal tachycardia at baseline.  Continue to monitor closely. Called at [place] and notified patient was 5-6cm, and progressed to 8cm at 0233 and I was in room at 0248 and patient was 9.5cm.  Pt. Was in hands/knees position and involuntarily pushing. Difficulty tracing FHR and FSE placed. FHR baseline 90s with positive scalp stimulation.  4L by nasal cannula and IVF bolus infusing. Pt. Turned to side lying positions. Cat 2 to Cat 3 tracing. Dr. Ronita Hipps asked to attend delivery for vacuum assisted delivery, but patient pushed well and was able to deliver spontaneously. NICU in attendance.   Lars Pinks, CNM

## 2019-04-04 NOTE — Progress Notes (Addendum)
[name] was referred for history of depression/anxiety. * Referral screened out by Clinical Social Worker because none of the following criteria appear to apply: ~ History of anxiety/depression during this pregnancy, or of post-partum depression following prior delivery. ~ Diagnosis of anxiety and/or depression within last 3 years. Per [name]'s chart review, [name] appears to be  diagnosed with anxiety/depression in 2001. OR * [name]'s symptoms currently being treated with medication and/or therapy.     Please contact the Clinical Social Worker if needs arise, by [name] request, or if [name] scores greater than 9/yes to question 10 on Edinburgh Postpartum Depression Screen. CSW will also follow for any further needs regarding infants possible diagnosis.     Janet Stewart, MSW, LCSW-A [facility] [phone]  

## 2019-04-05 NOTE — Progress Notes (Signed)
[name] scored 13 on Edinburgh Postnatal Depression Scale. [name] refused to see social work and explained to nurse that she had great support and a therapist she can talk to. [name] is also aware of signs and symptoms of postpartum depression and knows to call her OB if she has any of these symptoms. Will continue support for [name].

## 2019-04-05 NOTE — Discharge Summary (Signed)
Obstetric Discharge Summary Reason for Admission: rupture of membranes Prenatal Procedures: none Intrapartum Procedures: spontaneous vaginal delivery Postpartum Procedures: none Complications-Operative and Postpartum: none Hemoglobin  Date Value Ref Range Status  04/04/2019 10.1 (L) 12.0 - 15.0 g/dL Final   HCT  Date Value Ref Range Status  04/04/2019 32.4 (L) 36.0 - 46.0 % Final    Physical Exam:  General: alert, cooperative and no distress Lochia: appropriate Uterine Fundus: firm Incision: n/a DVT Evaluation: No evidence of DVT seen on physical exam.  Discharge Diagnoses: Term Pregnancy-delivered  Discharge Information: Date: 04/05/2019 Activity: pelvic rest Diet: routine Medications: Ibuprofen Condition: stable Instructions: refer to practice specific booklet Discharge to: home Follow-up Information    Murrell Redden Earlyne Iba, MD Follow up.   Specialty: Obstetrics and Gynecology Contact information: [place] [address] [phone]           Newborn Data: Live born female  Birth Weight: 8 lb 15 oz (4055 g) APGAR: 73, 8  Newborn Delivery   Birth date/time: 04/04/2019 03:19:00 Delivery type: Vaginal, Spontaneous      Home with mother.  Charyl Bigger 04/05/2019, 11:40 AM

## 2019-04-05 NOTE — Lactation Note (Signed)
This note was copied from a baby's chart. Lactation Consultation Note  Patient Name: Janet Stewart IOEVO'J Date: 04/05/2019 Reason for consult: Follow-up assessment;Early term 37-38.6wks;Infant weight loss;Other (Comment)(3% weight loss / repeat bilirubin)  Baby is 9 hours old  LC reviewed and updated the doc flow sheets with consult.  Baby has been consistently breast feeding . @ this consult baby awake and hungry/ latched easily and LC checked lip lines and  Flipped upper lip to flanged position.  Multiple swallows noted and per mom comfortable.  Nipple well rounded when baby released.  LC discussed the importance of STS feedings until baby is back to birth weight, gaining steadily and  Can stay awake for majority of feeding.  Discussed nutritive vs non - nutritive feeding patterns and the importance of hanging out latched.  LC stressed the importance of the baby only feeds 1st breast / not to allow the 2nd breast to get uncomfortable,  Release down with the [facility] / save milk.  Mom denies sore nipples . Sore nipple and engorgement prevention and tx reviewed.  Per mom  has  HAAKA ( hand pump ) and a Spectra 2 at  Home.  Per mom baby has been breast feeding well , caught on quicker than my other babies.  Mom expressed she has a hx of mastitis / LC reviewed the S/S's .  LC reviewed Index resources for Lactation.    Maternal Data Has patient been taught Hand Expression?: Yes  Feeding Feeding Type: Breast Fed  LATCH Score Latch: Grasps breast easily, tongue down, lips flanged, rhythmical sucking.  Audible Swallowing: Spontaneous and intermittent  Type of Nipple: Everted at rest and after stimulation  Comfort (Breast/Nipple): Filling, red/small blisters or bruises, mild/mod discomfort  Hold (Positioning): Assistance needed to correctly position infant at breast and maintain latch.  LATCH Score: 8  Interventions Interventions: Breast feeding basics reviewed;Assisted  with latch;Skin to skin;Breast compression;Adjust position;Support pillows  Lactation Tools Discussed/Used Tools: Shells Shell Type: Inverted WIC Program: No Pump Review: Milk Storage   Consult Status Consult Status: Complete Date: 04/05/19    Myer Haff 04/05/2019, 12:48 PM

## 2019-04-05 NOTE — Progress Notes (Signed)
No c/o; ambulating, voiding w/o difficulty, tol po nml lochia, nursing Wants d/c home  BP 120/80 (BP Location: Right Arm)   Pulse 94   Temp 98.2 F (36.8 C) (Oral)   Resp 20   Ht 5\' 3"  (1.6 m)   Wt 104.3 kg   LMP 06/18/2018   SpO2 97%   Breastfeeding Unknown   BMI 40.74 kg/m   A&ox3 rrr ctab Abd: soft, nt, nd; fundus firm and 2cm below umb LE: tr edema, nt bilat  CBC Latest Ref Rng & Units 04/04/2019 04/03/2019 12/20/2009  WBC 4.0 - 10.5 K/uL 18.4(H) 9.3 15.2(H)  Hemoglobin 12.0 - 15.0 g/dL 10.1(L) 10.7(L) 9.5(L)  Hematocrit 36.0 - 46.0 % 32.4(L) 34.8(L) 29.3(L)  Platelets 150 - 400 K/uL 249 258 248   A/p: ppd 1 s/p svd 1. Doing well, wants d/c, ok for d/c home today; f/u in office in 6 wks 2. Chronic anemia - contin iron q day, asymptomatic 3. gdma1 - f/u pp 4. Rubella immune 5. Rh pos

## 2023-05-02 ENCOUNTER — Other Ambulatory Visit (HOSPITAL_BASED_OUTPATIENT_CLINIC_OR_DEPARTMENT_OTHER): Payer: Self-pay

## 2023-05-02 MED ORDER — MOUNJARO 2.5 MG/0.5ML ~~LOC~~ SOAJ
2.5000 mg | SUBCUTANEOUS | 0 refills | Status: AC
  Filled 2023-05-02: qty 2, 28d supply, fill #0

## 2023-05-03 ENCOUNTER — Other Ambulatory Visit: Payer: Self-pay

## 2023-05-03 ENCOUNTER — Other Ambulatory Visit (HOSPITAL_BASED_OUTPATIENT_CLINIC_OR_DEPARTMENT_OTHER): Payer: Self-pay

## 2023-05-04 ENCOUNTER — Other Ambulatory Visit (HOSPITAL_BASED_OUTPATIENT_CLINIC_OR_DEPARTMENT_OTHER): Payer: Self-pay

## 2023-05-05 ENCOUNTER — Other Ambulatory Visit (HOSPITAL_BASED_OUTPATIENT_CLINIC_OR_DEPARTMENT_OTHER): Payer: Self-pay

## 2023-05-06 ENCOUNTER — Other Ambulatory Visit (HOSPITAL_BASED_OUTPATIENT_CLINIC_OR_DEPARTMENT_OTHER): Payer: Self-pay

## 2023-05-11 ENCOUNTER — Other Ambulatory Visit (HOSPITAL_BASED_OUTPATIENT_CLINIC_OR_DEPARTMENT_OTHER): Payer: Self-pay

## 2023-06-13 ENCOUNTER — Other Ambulatory Visit (HOSPITAL_BASED_OUTPATIENT_CLINIC_OR_DEPARTMENT_OTHER): Payer: Self-pay

## 2023-06-16 ENCOUNTER — Other Ambulatory Visit: Payer: Self-pay

## 2023-06-16 ENCOUNTER — Other Ambulatory Visit (HOSPITAL_BASED_OUTPATIENT_CLINIC_OR_DEPARTMENT_OTHER): Payer: Self-pay

## 2023-06-16 MED ORDER — MOUNJARO 2.5 MG/0.5ML ~~LOC~~ SOAJ
2.5000 mg | SUBCUTANEOUS | 0 refills | Status: AC
  Filled 2023-06-16 (×2): qty 2, 28d supply, fill #0

## 2023-06-16 MED ORDER — ESCITALOPRAM OXALATE 20 MG PO TABS
20.0000 mg | ORAL_TABLET | Freq: Every day | ORAL | 0 refills | Status: DC
  Filled 2023-06-16 (×2): qty 90, 90d supply, fill #0

## 2023-07-12 ENCOUNTER — Other Ambulatory Visit (HOSPITAL_BASED_OUTPATIENT_CLINIC_OR_DEPARTMENT_OTHER): Payer: Self-pay

## 2023-07-12 MED ORDER — MOUNJARO 5 MG/0.5ML ~~LOC~~ SOAJ
5.0000 mg | SUBCUTANEOUS | 0 refills | Status: AC
  Filled 2023-07-12: qty 2, 28d supply, fill #0

## 2023-08-16 ENCOUNTER — Other Ambulatory Visit (HOSPITAL_BASED_OUTPATIENT_CLINIC_OR_DEPARTMENT_OTHER): Payer: Self-pay

## 2023-08-16 MED ORDER — LEVOTHYROXINE SODIUM 125 MCG PO TABS: 125.0000 ug | ORAL_TABLET | Freq: Every day | ORAL | 0 refills | Status: AC

## 2023-08-16 MED ORDER — LEVOTHYROXINE SODIUM 125 MCG PO TABS
125.0000 ug | ORAL_TABLET | Freq: Every day | ORAL | 0 refills | Status: AC
  Filled 2023-08-16: qty 30, 30d supply, fill #0

## 2023-08-16 MED ORDER — MOUNJARO 7.5 MG/0.5ML ~~LOC~~ SOAJ
7.5000 mg | SUBCUTANEOUS | 0 refills | Status: DC
  Filled 2023-08-16: qty 2, 28d supply, fill #0

## 2023-09-06 ENCOUNTER — Other Ambulatory Visit (HOSPITAL_BASED_OUTPATIENT_CLINIC_OR_DEPARTMENT_OTHER): Payer: Self-pay

## 2023-09-08 ENCOUNTER — Other Ambulatory Visit (HOSPITAL_BASED_OUTPATIENT_CLINIC_OR_DEPARTMENT_OTHER): Payer: Self-pay

## 2023-09-09 ENCOUNTER — Other Ambulatory Visit (HOSPITAL_BASED_OUTPATIENT_CLINIC_OR_DEPARTMENT_OTHER): Payer: Self-pay

## 2023-09-09 MED ORDER — MOUNJARO 7.5 MG/0.5ML ~~LOC~~ SOAJ
7.5000 mg | SUBCUTANEOUS | 0 refills | Status: AC
  Filled 2023-09-09 – 2023-09-10 (×2): qty 2, 28d supply, fill #0

## 2023-09-10 ENCOUNTER — Other Ambulatory Visit (HOSPITAL_BASED_OUTPATIENT_CLINIC_OR_DEPARTMENT_OTHER): Payer: Self-pay

## 2023-09-14 ENCOUNTER — Other Ambulatory Visit (HOSPITAL_BASED_OUTPATIENT_CLINIC_OR_DEPARTMENT_OTHER): Payer: Self-pay

## 2023-09-14 MED ORDER — MOUNJARO 10 MG/0.5ML ~~LOC~~ SOAJ
10.0000 mg | SUBCUTANEOUS | 0 refills | Status: AC
  Filled 2023-09-14: qty 2, 28d supply, fill #0

## 2023-09-14 MED ORDER — LEVOTHYROXINE SODIUM 125 MCG PO TABS
125.0000 ug | ORAL_TABLET | Freq: Every day | ORAL | 1 refills | Status: AC
  Filled 2023-09-14: qty 30, 30d supply, fill #0
  Filled 2023-10-20: qty 30, 30d supply, fill #1

## 2023-09-14 MED ORDER — ESCITALOPRAM OXALATE 20 MG PO TABS
20.0000 mg | ORAL_TABLET | Freq: Every day | ORAL | 0 refills | Status: AC
  Filled 2023-09-14 (×2): qty 90, 90d supply, fill #0

## 2023-10-20 ENCOUNTER — Other Ambulatory Visit: Payer: Self-pay

## 2023-11-02 ENCOUNTER — Other Ambulatory Visit (HOSPITAL_BASED_OUTPATIENT_CLINIC_OR_DEPARTMENT_OTHER): Payer: Self-pay

## 2023-11-02 MED ORDER — LEVOTHYROXINE SODIUM 137 MCG PO TABS
137.0000 ug | ORAL_TABLET | Freq: Every day | ORAL | 1 refills | Status: DC
  Filled 2023-11-02: qty 30, 30d supply, fill #0
  Filled 2023-12-09: qty 30, 30d supply, fill #1

## 2023-11-02 MED ORDER — MOUNJARO 12.5 MG/0.5ML ~~LOC~~ SOAJ
12.5000 mg | SUBCUTANEOUS | 0 refills | Status: AC
  Filled 2023-11-02: qty 2, 28d supply, fill #0

## 2023-11-29 ENCOUNTER — Other Ambulatory Visit (HOSPITAL_BASED_OUTPATIENT_CLINIC_OR_DEPARTMENT_OTHER): Payer: Self-pay

## 2023-11-29 MED ORDER — MOUNJARO 15 MG/0.5ML ~~LOC~~ SOAJ
15.0000 mg | SUBCUTANEOUS | 0 refills | Status: DC
  Filled 2023-11-29: qty 2, 28d supply, fill #0

## 2023-11-29 MED ORDER — FAMOTIDINE 20 MG PO TABS
20.0000 mg | ORAL_TABLET | Freq: Two times a day (BID) | ORAL | 0 refills | Status: AC
  Filled 2023-11-29: qty 180, 90d supply, fill #0

## 2023-11-29 MED ORDER — ATORVASTATIN CALCIUM 10 MG PO TABS
10.0000 mg | ORAL_TABLET | Freq: Every day | ORAL | 0 refills | Status: AC
  Filled 2023-11-29: qty 90, 90d supply, fill #0

## 2024-01-03 ENCOUNTER — Other Ambulatory Visit (HOSPITAL_BASED_OUTPATIENT_CLINIC_OR_DEPARTMENT_OTHER): Payer: Self-pay

## 2024-01-03 MED ORDER — MOUNJARO 15 MG/0.5ML ~~LOC~~ SOAJ
15.0000 mg | SUBCUTANEOUS | 0 refills | Status: AC
  Filled 2024-01-03: qty 6, 84d supply, fill #0

## 2024-01-03 MED ORDER — ESCITALOPRAM OXALATE 20 MG PO TABS
20.0000 mg | ORAL_TABLET | Freq: Every day | ORAL | 1 refills | Status: DC
  Filled 2024-01-03: qty 30, 30d supply, fill #0
  Filled 2024-01-27: qty 30, 30d supply, fill #1

## 2024-01-03 MED ORDER — LEVOTHYROXINE SODIUM 137 MCG PO TABS
137.0000 ug | ORAL_TABLET | Freq: Every day | ORAL | 1 refills | Status: DC
  Filled 2024-01-03: qty 30, 30d supply, fill #0
  Filled 2024-02-02: qty 30, 30d supply, fill #1

## 2024-03-05 ENCOUNTER — Other Ambulatory Visit (HOSPITAL_BASED_OUTPATIENT_CLINIC_OR_DEPARTMENT_OTHER): Payer: Self-pay

## 2024-03-05 MED ORDER — LEVOTHYROXINE SODIUM 137 MCG PO TABS
137.0000 ug | ORAL_TABLET | Freq: Every day | ORAL | 0 refills | Status: DC
  Filled 2024-03-05: qty 30, 30d supply, fill #0

## 2024-03-05 MED ORDER — FAMOTIDINE 20 MG PO TABS
20.0000 mg | ORAL_TABLET | Freq: Every day | ORAL | 0 refills | Status: AC
  Filled 2024-03-05: qty 90, 90d supply, fill #0

## 2024-03-05 MED ORDER — ESCITALOPRAM OXALATE 20 MG PO TABS
20.0000 mg | ORAL_TABLET | Freq: Every day | ORAL | 1 refills | Status: AC
  Filled 2024-03-05: qty 30, 30d supply, fill #0
  Filled 2024-04-08 – 2024-11-09 (×2): qty 30, 30d supply, fill #1

## 2024-03-12 ENCOUNTER — Other Ambulatory Visit (HOSPITAL_BASED_OUTPATIENT_CLINIC_OR_DEPARTMENT_OTHER): Payer: Self-pay

## 2024-03-12 MED ORDER — ATORVASTATIN CALCIUM 10 MG PO TABS
10.0000 mg | ORAL_TABLET | Freq: Every day | ORAL | 0 refills | Status: AC
  Filled 2024-03-12: qty 30, 30d supply, fill #0

## 2024-04-19 ENCOUNTER — Other Ambulatory Visit (HOSPITAL_BASED_OUTPATIENT_CLINIC_OR_DEPARTMENT_OTHER): Payer: Self-pay

## 2024-04-27 ENCOUNTER — Other Ambulatory Visit (HOSPITAL_BASED_OUTPATIENT_CLINIC_OR_DEPARTMENT_OTHER): Payer: Self-pay

## 2024-04-27 ENCOUNTER — Other Ambulatory Visit: Payer: Self-pay

## 2024-04-27 MED ORDER — ATORVASTATIN CALCIUM 20 MG PO TABS
20.0000 mg | ORAL_TABLET | Freq: Every day | ORAL | 0 refills | Status: AC
  Filled 2024-04-27: qty 30, 30d supply, fill #0

## 2024-04-27 MED ORDER — LEVOTHYROXINE SODIUM 137 MCG PO TABS
137.0000 ug | ORAL_TABLET | Freq: Every day | ORAL | 0 refills | Status: AC
  Filled 2024-04-27: qty 30, 30d supply, fill #0

## 2024-04-27 MED ORDER — MOUNJARO 15 MG/0.5ML ~~LOC~~ SOAJ
15.0000 mg | SUBCUTANEOUS | 0 refills | Status: AC
  Filled 2024-04-27: qty 2, 28d supply, fill #0

## 2024-11-09 ENCOUNTER — Other Ambulatory Visit: Payer: Self-pay

## 2024-11-09 ENCOUNTER — Other Ambulatory Visit (HOSPITAL_BASED_OUTPATIENT_CLINIC_OR_DEPARTMENT_OTHER): Payer: Self-pay

## 2024-11-09 MED ORDER — MOUNJARO 15 MG/0.5ML ~~LOC~~ SOAJ
15.0000 mg | SUBCUTANEOUS | 0 refills | Status: AC
  Filled 2024-11-09: qty 6, 84d supply, fill #0
  Filled 2024-11-09: qty 2, 28d supply, fill #0

## 2024-11-09 MED ORDER — ESCITALOPRAM OXALATE 20 MG PO TABS
20.0000 mg | ORAL_TABLET | Freq: Every day | ORAL | 0 refills | Status: AC
  Filled 2024-11-09: qty 90, 90d supply, fill #0
  Filled ????-??-??: fill #0

## 2024-11-09 MED ORDER — ATORVASTATIN CALCIUM 20 MG PO TABS
20.0000 mg | ORAL_TABLET | Freq: Every day | ORAL | 0 refills | Status: AC
  Filled 2024-11-09 (×2): qty 90, 90d supply, fill #0
  Filled ????-??-??: fill #0

## 2024-11-09 MED ORDER — LEVOTHYROXINE SODIUM 125 MCG PO TABS
125.0000 ug | ORAL_TABLET | Freq: Every day | ORAL | 1 refills | Status: AC
  Filled 2024-11-09 (×2): qty 30, 30d supply, fill #0
